# Patient Record
Sex: Female | Born: 1995 | Race: White | Hispanic: No | Marital: Single | Smoking: Never smoker
Health system: Southern US, Community
[De-identification: ages and names within clinical notes are randomized; demographics above are authoritative.]

## PROBLEM LIST (undated history)

## (undated) ENCOUNTER — Inpatient Hospital Stay: Payer: Self-pay

---

## 2014-12-31 LAB — OB RESULTS CONSOLE RUBELLA ANTIBODY, IGM: Rubella: IMMUNE

## 2014-12-31 LAB — OB RESULTS CONSOLE GC/CHLAMYDIA
CHLAMYDIA, DNA PROBE: NEGATIVE
GC PROBE AMP, GENITAL: NEGATIVE

## 2014-12-31 LAB — OB RESULTS CONSOLE HEPATITIS B SURFACE ANTIGEN: Hepatitis B Surface Ag: NEGATIVE

## 2015-03-27 ENCOUNTER — Encounter: Payer: Self-pay | Admitting: *Deleted

## 2015-03-27 ENCOUNTER — Observation Stay
Admission: EM | Admit: 2015-03-27 | Discharge: 2015-03-28 | Disposition: A | Discharge status: Home / Self Care | Payer: Medicaid Other | Attending: Obstetrics & Gynecology | Admitting: Obstetrics & Gynecology

## 2015-03-27 ENCOUNTER — Observation Stay: Payer: Medicaid Other

## 2015-03-27 DIAGNOSIS — R319 Hematuria, unspecified: Secondary | ICD-10-CM | POA: Diagnosis present

## 2015-03-27 DIAGNOSIS — R58 Hemorrhage, not elsewhere classified: Secondary | ICD-10-CM

## 2015-03-27 DIAGNOSIS — O2692 Pregnancy related conditions, unspecified, second trimester: Secondary | ICD-10-CM | POA: Diagnosis not present

## 2015-03-27 DIAGNOSIS — Z3A20 20 weeks gestation of pregnancy: Secondary | ICD-10-CM | POA: Diagnosis not present

## 2015-03-27 DIAGNOSIS — R310 Gross hematuria: Secondary | ICD-10-CM | POA: Insufficient documentation

## 2015-03-27 LAB — URINALYSIS COMPLETE WITH MICROSCOPIC (ARMC ONLY)
BILIRUBIN URINE: NEGATIVE
Bacteria, UA: NONE SEEN
Glucose, UA: NEGATIVE mg/dL
KETONES UR: NEGATIVE mg/dL
Nitrite: NEGATIVE
Protein, ur: 30 mg/dL — AB
Specific Gravity, Urine: 1.005 (ref 1.005–1.030)
pH: 8 (ref 5.0–8.0)

## 2015-03-27 NOTE — Discharge Summary (Signed)
Patty Ray is a 19 y.o. female. She is at [redacted]w[redacted]d gestation.  Indication: blood in urine  S: Resting comfortably. no CTX, no VB. Patient noted blood clots in her urine in the last day.  She has ruled out vaginal source using a q-tip in her vagina.  Every time she urinates she has visible blood and small clots. She deines dysuria, back or flank pain, feeling unwell.  O:  LMP 11/02/2014 (Approximate)    Gen: NAD, AAOx3      Abd: FNTTP      Ext: Non-tender, Nonedmeatous Back: no CVA tenderness.   FHT: present TOCO: quiet SVE: closed/long/high no blood on glove.  Results for orders placed or performed during the hospital encounter of 03/27/15 (from the past 48 hour(s))  Urinalysis complete, with microscopic [facility] only)   Collection Time: 03/27/15  5:27 PM  Result Value Ref Range   Color, Urine RED (A) YELLOW   APPearance HAZY (A) CLEAR   Glucose, UA NEGATIVE NEGATIVE mg/dL   Bilirubin Urine NEGATIVE NEGATIVE   Ketones, ur NEGATIVE NEGATIVE mg/dL   Specific Gravity, Urine 1.005 1.005 - 1.030   Hgb urine dipstick 3+ (A) NEGATIVE   pH 8.0 5.0 - 8.0   Protein, ur 30 (A) NEGATIVE mg/dL   Nitrite NEGATIVE NEGATIVE   Leukocytes, UA TRACE (A) NEGATIVE   RBC / HPF TOO NUMEROUS TO COUNT 0 - 5 RBC/hpf   WBC, UA TOO NUMEROUS TO COUNT 0 - 5 WBC/hpf   Bacteria, UA NONE SEEN NONE SEEN   Squamous Epithelial / LPF 0-5 (A) NONE SEEN   Amorphous Crystal PRESENT      US Ob Limited  03/28/2015   CLINICAL DATA:  Pelvic cramping on an off for several days.  EXAM: LIMITED OBSTETRIC ULTRASOUND  FINDINGS: Number of Fetuses: 1  Heart Rate:  155 bpm  Movement: Yes  Presentation: Cephalic  Placental Location: Anterior  Previa: No  Amniotic Fluid (Subjective):  Within normal limits.  BPD:  5.2cm 21w  5d  MATERNAL FINDINGS:  Cervix:  Appears closed.  Uterus/Adnexae:  Ovaries not visualized.  IMPRESSION: Single living intrauterine fetus estimated at 21 weeks and 5 days gestation.  Normal placenta and  normal amniotic fluid volume.  This exam is performed on an emergent basis and does not comprehensively evaluate fetal size, dating, or anatomy; follow-up complete OB US should be considered if further fetal assessment is warranted.   Electronically Signed   By: Rudie Meyer M.D.   On: 03/28/2015 09:52   US Renal  03/27/2015   CLINICAL DATA:  Bloody urine  EXAM: RENAL / URINARY TRACT ULTRASOUND COMPLETE  COMPARISON:  None.  FINDINGS: Right Kidney:  Length: 10.6 cm.  Mild hydronephrosis.  No calcified calculi.  Left Kidney:  Length: 10.6 cm.  No frank hydronephrosis.  No calcified calculi.  Bladder:  Appears normal for degree of bladder distention.  IMPRESSION: Mild right hydronephrosis. No calcified renal calculi. Unremarkable urinary bladder.   Electronically Signed   By: Natasha Mead M.D.   On: 03/27/2015 23:16      A/P:  19yo G1P0 @ 20.5 with asymptomatic gross hematuria.   Labor: not present.   No evidence of stone in bladder or kidney, mild hydronephrosis.    Report does not state - will need further investigation if placenta is having interface with bladder (precreta).  Will repeat ultrasound next week.  Does not appear to be however images unclear.  Urine culture pending  Will refer to urology as  outpatient, asymptomatic gross hematuria.  D/c home stable, precautions reviewed, follow-up as scheduled.   Ward, Elenora Fender

## 2015-03-27 NOTE — OB Triage Note (Signed)
Patient complaint of blood in urine since yesterday with cramping "of and onfor the past few days". Declines any other complaints

## 2015-04-01 ENCOUNTER — Telehealth: Payer: Self-pay | Admitting: Emergency Medicine

## 2015-04-01 NOTE — ED Notes (Signed)
Pt called for culture result.  Explained that urine culture was done but has multiple organisms and sensitivities were not done.  She has appt with cward Friday.  Patient says she is feeling fine now.  i told her that cward will decide if test should be repeated.

## 2015-04-17 LAB — URINE CULTURE

## 2015-05-19 LAB — OB RESULTS CONSOLE HGB/HCT, BLOOD
HCT: 30 %
Hemoglobin: 10.2 g/dL

## 2015-05-19 LAB — OB RESULTS CONSOLE PLATELET COUNT: PLATELETS: 187 10*3/uL

## 2015-05-19 LAB — OB RESULTS CONSOLE HIV ANTIBODY (ROUTINE TESTING): HIV: NONREACTIVE

## 2015-05-19 LAB — OB RESULTS CONSOLE RPR: RPR: NONREACTIVE

## 2015-06-19 ENCOUNTER — Observation Stay
Admission: EM | Admit: 2015-06-19 | Discharge: 2015-06-19 | Disposition: A | Discharge status: Home / Self Care | Payer: Medicaid Other | Attending: Obstetrics & Gynecology | Admitting: Obstetrics & Gynecology

## 2015-06-19 DIAGNOSIS — O479 False labor, unspecified: Secondary | ICD-10-CM | POA: Diagnosis present

## 2015-06-19 DIAGNOSIS — Z3A33 33 weeks gestation of pregnancy: Secondary | ICD-10-CM | POA: Diagnosis not present

## 2015-06-19 DIAGNOSIS — O4700 False labor before 37 completed weeks of gestation, unspecified trimester: Secondary | ICD-10-CM | POA: Diagnosis present

## 2015-06-19 LAB — URINALYSIS COMPLETE WITH MICROSCOPIC (ARMC ONLY)
Bilirubin Urine: NEGATIVE
GLUCOSE, UA: NEGATIVE mg/dL
Hgb urine dipstick: NEGATIVE
Leukocytes, UA: NEGATIVE
NITRITE: NEGATIVE
Protein, ur: NEGATIVE mg/dL
SPECIFIC GRAVITY, URINE: 1.005 (ref 1.005–1.030)
pH: 7 (ref 5.0–8.0)

## 2015-06-19 LAB — FETAL FIBRONECTIN: Fetal Fibronectin: NEGATIVE

## 2015-06-19 MED ORDER — LACTATED RINGERS IV BOLUS (SEPSIS)
1000.0000 mL | Freq: Once | INTRAVENOUS | Status: AC
  Administered 2015-06-19: 1000 mL via INTRAVENOUS

## 2015-06-19 MED ORDER — TERBUTALINE SULFATE 1 MG/ML IJ SOLN
0.2500 mg | Freq: Once | INTRAMUSCULAR | Status: AC
  Administered 2015-06-19: 0.25 mg via SUBCUTANEOUS
  Filled 2015-06-19: qty 1

## 2015-06-19 NOTE — Plan of Care (Signed)
Notified dr ward of pt contracting q 2-3 min. Order received for iv fluids. Will wait on results of FFN for further orders.

## 2015-06-19 NOTE — Discharge Summary (Signed)
Patty Ray is a 19 y.o. female. She is at 7250w5d gestation.    Indication: Presents to L&D with complaints of contractions.  Started earlier in the day and have grown progressively stronger and more frequent.  S: Resting comfortably, no VB. Active fetal movement. Concerned about preterm labor. O:  BP 120/72 mmHg  Pulse 95  Temp(Src) 98.9 F (37.2 C)  LMP 11/02/2014 (Approximate) Results for orders placed or performed during the hospital encounter of 06/19/15 (from the past 48 hour(s))  Fetal fibronectin   Collection Time: 06/19/15  3:28 AM  Result Value Ref Range   Fetal Fibronectin NEGATIVE NEGATIVE   Appearance, FETFIB CLEAR CLEAR  Urinalysis complete, with microscopic ([facility] only)   Collection Time: 06/19/15  3:29 AM  Result Value Ref Range   Color, Urine STRAW (A) YELLOW   APPearance CLEAR (A) CLEAR   Glucose, UA NEGATIVE NEGATIVE mg/dL   Bilirubin Urine NEGATIVE NEGATIVE   Ketones, ur 1+ (A) NEGATIVE mg/dL   Specific Gravity, Urine 1.005 1.005 - 1.030   Hgb urine dipstick NEGATIVE NEGATIVE   pH 7.0 5.0 - 8.0   Protein, ur NEGATIVE NEGATIVE mg/dL   Nitrite NEGATIVE NEGATIVE   Leukocytes, UA NEGATIVE NEGATIVE   RBC / HPF 0-5 0 - 5 RBC/hpf   WBC, UA 0-5 0 - 5 WBC/hpf   Bacteria, UA RARE (A) NONE SEEN   Squamous Epithelial / LPF 0-5 (A) NONE SEEN   Mucous PRESENT      Gen: NAD, AAOx3      Abd: FNTTP      Ext: Non-tender, Nonedmeatous    FHT: 145 mod + accels no decels TOCO:q2-3 min SVE: FT/70/-3   A/P: 19yo G1P0 at 32.5 with rule out preterm labor.    Labor: IV fluids, gave terbutaline for resolution of contractions; continued observation   FFN negative  Fetal Wellbeing: Reassuring Cat 1 tracing.  D/c home stable, precautions reviewed, follow-up as scheduled.   Ward, Chelsea C   ADDENDUM: The patient was monitored for about 5 hours with no cervical change.  After receiving a single dose of terbutaline and 1 liter of normal saline IV, her contractions  slowed considerably. At the time of discharge she was not feeling any contractions. She was given strong precautions to return to L&D or the clinic should her contractions return.  I recommended earlier follow up in clinic (early next week) to make sure she has had no further symptoms.  She voiced understanding and agreement with the plan.    Thomasene MohairStephen Jackson, MD 06/19/2015 11:04 AM

## 2015-06-19 NOTE — OB Triage Note (Signed)
Pt presents to l/d with c/o contractions that started at 8pm while she was at work.

## 2015-07-12 NOTE — L&D Delivery Note (Signed)
Delivery Note At 2:27 PM a viable female was delivered via Vaginal, Spontaneous Delivery (Presentation: Right Occiput Anterior).  APGAR: 7,8 ; weight 7 lb 13.6 oz (3560 g).   Placenta status: Intact, Spontaneous.  Cord: 3 vessels with the following complications: None apparent.  Cord pH: n/a  TIGHT NUCHAL X1 unable to be reduced at perineum; delivered through.  Anesthesia:  epidural Episiotomy:  no Lacerations:  1st degree perineal Suture Repair: 3.0 vicryl Est. Blood Loss (mL):  200cc  Mom to postpartum.  Baby to Couplet care / Skin to Skin.  Was called to supervise delivery due to prolonged deep decelerations with pushing.  Ultimately started pushing with every other contraction and strip remained WNL.  Baby was ROP and was rotated both manually and with maternal effort to ROA.  Excellent maternal effort.  Baby was delivered and tight nuchal cord was noted.  Attempt to reduce at perineum was unsuccessful and baby was delivered through an placed on mom's chest.  Terminal meconium noted.  After cord became pulseless >60 seconds of delayed clamping, the cord was doubly clamped and cut by FOB, then attended to under the warmer by pediatric team.  Cord blood was colleted and placenta delivered spontaneously.  Shallow 1st degree repaired with 3-0 vicryl in standard fashion.  Baby was placed back on mom's chest for skin to skin and we sang happy birthday to Scientist, physiological.  Ward, Chelsea C 08/05/2015, 2:43 PM

## 2015-07-13 ENCOUNTER — Observation Stay
Admission: EM | Admit: 2015-07-13 | Discharge: 2015-07-13 | Disposition: A | Discharge status: Home / Self Care | Payer: Medicaid Other | Attending: Obstetrics and Gynecology | Admitting: Obstetrics and Gynecology

## 2015-07-13 DIAGNOSIS — O4700 False labor before 37 completed weeks of gestation, unspecified trimester: Secondary | ICD-10-CM | POA: Diagnosis present

## 2015-07-13 DIAGNOSIS — Z3A36 36 weeks gestation of pregnancy: Secondary | ICD-10-CM | POA: Insufficient documentation

## 2015-07-13 LAB — URINALYSIS COMPLETE WITH MICROSCOPIC (ARMC ONLY)
Bilirubin Urine: NEGATIVE
Glucose, UA: NEGATIVE mg/dL
HGB URINE DIPSTICK: NEGATIVE
LEUKOCYTES UA: NEGATIVE
NITRITE: NEGATIVE
PH: 7 (ref 5.0–8.0)
Protein, ur: NEGATIVE mg/dL
SPECIFIC GRAVITY, URINE: 1.009 (ref 1.005–1.030)

## 2015-07-13 MED ORDER — ACETAMINOPHEN 325 MG PO TABS: 650.0000 mg | ORAL_TABLET | ORAL | Status: DC | PRN

## 2015-07-13 NOTE — Discharge Summary (Signed)
Obstetric History and Physical  Patty Ray is a 20 y.o. G1P0 with Estimated Date of Delivery: 08/09/15 per 5 wk US who presents at 5816w1d  presenting for c/o regular contractions. Patient states she has been having  no vaginal bleeding, intact membranes, with active fetal movement.    Prenatal Course Source of Care: [facility]  with transfer of care at 18 weeks Pregnancy complications or risks: Patient Active Problem List   Diagnosis Date Noted  . Threatened preterm labor 07/13/2015  . Preterm contractions 06/19/2015  . Labor and delivery indication for care or intervention 06/19/2015     Prenatal Transfer Tool   No past medical history.  No past surgical history.  OB History  Gravida Para Term Preterm AB SAB TAB Ectopic Multiple Living  1             # Outcome Date GA Lbr Len/2nd Weight Sex Delivery Anes PTL Lv  1 Current               Social History   Social History  . Marital Status: Single    Spouse Name: N/A  . Number of Children: N/A  . Years of Education: N/A   Social History Main Topics  . Smoking status: Never Smoker   . Smokeless tobacco: Never Used  . Alcohol Use: No  . Drug Use: No  . Sexual Activity: Yes   Other Topics Concern  . None   Social History Narrative    No family history on file.  No prescriptions prior to admission    No Known Allergies  Review of Systems: Negative except for what is mentioned in HPI.  Physical Exam: BP 115/63 mmHg  Pulse 117  Temp(Src) 98.1 F (36.7 C) (Oral)  Resp 16  Ht 5' (1.524 m)  Wt 132 lb (59.875 kg)  BMI 25.78 kg/m2  LMP 11/02/2014 (Approximate) GENERAL: Well-developed, well-nourished female in no acute distress.  Cervical Exam: 1/thick per RN, unchanged after 1 hour FHT: Category: 1 Baseline rate 135 bpm   Variability moderate  Accelerations present   Decelerations none Contractions: Every 1-5 mins, mild   Pertinent Labs/Studies:   UA: Negative except 1+ Ketones, mucous present  Assessment  : IUP at 7416w1d with preterm contractions without evidence of labor  Plan: Discharge Home  Preterm labor precautions - f/u at office as scheduled this week

## 2015-07-13 NOTE — OB Triage Note (Addendum)
Mrs. Sharon SellerLloyd is a 20 yo G1P0 at 36.1 gestation presenting to the unit for contractions every 2 minutes since 4pm.  The contractions decrease when she is walking and are worse when she's sitting or lying down. She reports +FM, -LOF, -HA, -Visual Disturbances, -N/V, -Swelling. She has some mucousy discharge x 1 week.  Patient states that she has been drinking a lot of water and is well hydrated.   Lesia Hausenina M Curby Carswell

## 2015-07-13 NOTE — Progress Notes (Signed)
Patient discharged to home in stable condition.  Labor precautions reviewed.  Patient advised to continue with regularly scheduled prenatal care and to continue with po hydration. Lesia Hausenina M Kory Rains

## 2015-08-05 ENCOUNTER — Inpatient Hospital Stay: Payer: Medicaid Other | Admitting: Registered Nurse

## 2015-08-05 ENCOUNTER — Inpatient Hospital Stay
Admission: EM | Admit: 2015-08-05 | Discharge: 2015-08-07 | DRG: 775 | Disposition: A | Discharge status: Home / Self Care | Payer: Medicaid Other | Attending: Obstetrics & Gynecology | Admitting: Obstetrics & Gynecology

## 2015-08-05 DIAGNOSIS — O4292 Full-term premature rupture of membranes, unspecified as to length of time between rupture and onset of labor: Principal | ICD-10-CM | POA: Diagnosis present

## 2015-08-05 DIAGNOSIS — O4290 Premature rupture of membranes, unspecified as to length of time between rupture and onset of labor, unspecified weeks of gestation: Secondary | ICD-10-CM | POA: Diagnosis present

## 2015-08-05 DIAGNOSIS — Z3A39 39 weeks gestation of pregnancy: Secondary | ICD-10-CM

## 2015-08-05 LAB — CBC
HCT: 30.3 % — ABNORMAL LOW (ref 35.0–47.0)
Hemoglobin: 10.3 g/dL — ABNORMAL LOW (ref 12.0–16.0)
MCH: 30.4 pg (ref 26.0–34.0)
MCHC: 33.9 g/dL (ref 32.0–36.0)
MCV: 89.9 fL (ref 80.0–100.0)
PLATELETS: 212 10*3/uL (ref 150–440)
RBC: 3.37 MIL/uL — ABNORMAL LOW (ref 3.80–5.20)
RDW: 12.7 % (ref 11.5–14.5)
WBC: 8.8 10*3/uL (ref 3.6–11.0)

## 2015-08-05 MED ORDER — OXYTOCIN 10 UNIT/ML IJ SOLN
INTRAMUSCULAR | Status: AC
  Filled 2015-08-05: qty 2

## 2015-08-05 MED ORDER — AMMONIA AROMATIC IN INHA
RESPIRATORY_TRACT | Status: AC
  Filled 2015-08-05: qty 10

## 2015-08-05 MED ORDER — LIDOCAINE-EPINEPHRINE (PF) 1.5 %-1:200000 IJ SOLN
INTRAMUSCULAR | Status: DC | PRN
  Administered 2015-08-05: 3 mL via EPIDURAL

## 2015-08-05 MED ORDER — IBUPROFEN 600 MG PO TABS
600.0000 mg | ORAL_TABLET | Freq: Four times a day (QID) | ORAL | Status: DC
  Administered 2015-08-05 – 2015-08-07 (×8): 600 mg via ORAL
  Filled 2015-08-05 (×8): qty 1

## 2015-08-05 MED ORDER — CITRIC ACID-SODIUM CITRATE 334-500 MG/5ML PO SOLN
30.0000 mL | ORAL | Status: DC | PRN
  Administered 2015-08-05: 30 mL via ORAL
  Filled 2015-08-05: qty 30

## 2015-08-05 MED ORDER — DIBUCAINE 1 % RE OINT: 1.0000 "application " | TOPICAL_OINTMENT | RECTAL | Status: DC | PRN

## 2015-08-05 MED ORDER — BENZOCAINE-MENTHOL 20-0.5 % EX AERO
1.0000 "application " | INHALATION_SPRAY | CUTANEOUS | Status: DC | PRN
  Filled 2015-08-05: qty 56

## 2015-08-05 MED ORDER — DOCUSATE SODIUM 100 MG PO CAPS
100.0000 mg | ORAL_CAPSULE | Freq: Two times a day (BID) | ORAL | Status: DC
  Administered 2015-08-06 – 2015-08-07 (×4): 100 mg via ORAL
  Filled 2015-08-05 (×4): qty 1

## 2015-08-05 MED ORDER — LIDOCAINE HCL (PF) 1 % IJ SOLN
30.0000 mL | INTRAMUSCULAR | Status: DC | PRN
  Filled 2015-08-05: qty 30

## 2015-08-05 MED ORDER — BUPIVACAINE HCL (PF) 0.25 % IJ SOLN
INTRAMUSCULAR | Status: DC | PRN
  Administered 2015-08-05 (×2): 4 mL via EPIDURAL

## 2015-08-05 MED ORDER — LIDOCAINE HCL (PF) 1 % IJ SOLN
INTRAMUSCULAR | Status: AC
  Filled 2015-08-05: qty 30

## 2015-08-05 MED ORDER — FENTANYL 2.5 MCG/ML W/ROPIVACAINE 0.2% IN NS 100 ML EPIDURAL INFUSION (ARMC-ANES)
EPIDURAL | Status: AC
  Administered 2015-08-05: 10 mL/h via EPIDURAL
  Filled 2015-08-05: qty 100

## 2015-08-05 MED ORDER — WITCH HAZEL-GLYCERIN EX PADS: 1.0000 "application " | MEDICATED_PAD | CUTANEOUS | Status: DC | PRN

## 2015-08-05 MED ORDER — LANOLIN HYDROUS EX OINT: TOPICAL_OINTMENT | CUTANEOUS | Status: DC | PRN

## 2015-08-05 MED ORDER — HYDROCODONE-ACETAMINOPHEN 5-325 MG PO TABS: 1.0000 | ORAL_TABLET | Freq: Four times a day (QID) | ORAL | Status: DC | PRN

## 2015-08-05 MED ORDER — PRENATAL MULTIVITAMIN CH
1.0000 | ORAL_TABLET | Freq: Every day | ORAL | Status: DC
  Administered 2015-08-06 – 2015-08-07 (×2): 1 via ORAL
  Filled 2015-08-05 (×2): qty 1

## 2015-08-05 MED ORDER — SIMETHICONE 80 MG PO CHEW: 80.0000 mg | CHEWABLE_TABLET | ORAL | Status: DC | PRN

## 2015-08-05 MED ORDER — LACTATED RINGERS IV SOLN
INTRAVENOUS | Status: DC
  Administered 2015-08-05 (×3): via INTRAVENOUS

## 2015-08-05 MED ORDER — ACETAMINOPHEN 325 MG PO TABS: 650.0000 mg | ORAL_TABLET | ORAL | Status: DC | PRN

## 2015-08-05 MED ORDER — ONDANSETRON HCL 4 MG/2ML IJ SOLN: 4.0000 mg | INTRAMUSCULAR | Status: DC | PRN

## 2015-08-05 MED ORDER — OXYTOCIN BOLUS FROM INFUSION
500.0000 mL | INTRAVENOUS | Status: DC
  Administered 2015-08-05: 500 mL via INTRAVENOUS

## 2015-08-05 MED ORDER — OXYTOCIN 40 UNITS IN LACTATED RINGERS INFUSION - SIMPLE MED
2.5000 [IU]/h | INTRAVENOUS | Status: DC
  Administered 2015-08-05: 2.5 [IU]/h via INTRAVENOUS
  Filled 2015-08-05: qty 1000

## 2015-08-05 MED ORDER — FERROUS SULFATE 325 (65 FE) MG PO TABS
325.0000 mg | ORAL_TABLET | Freq: Every day | ORAL | Status: DC
  Administered 2015-08-06 – 2015-08-07 (×2): 325 mg via ORAL
  Filled 2015-08-05 (×2): qty 1

## 2015-08-05 MED ORDER — ONDANSETRON HCL 4 MG PO TABS: 4.0000 mg | ORAL_TABLET | ORAL | Status: DC | PRN

## 2015-08-05 MED ORDER — MISOPROSTOL 200 MCG PO TABS
ORAL_TABLET | ORAL | Status: AC
  Filled 2015-08-05: qty 4

## 2015-08-05 MED ORDER — LACTATED RINGERS IV SOLN
500.0000 mL | INTRAVENOUS | Status: DC | PRN
  Administered 2015-08-05 (×2): 500 mL via INTRAVENOUS

## 2015-08-05 MED ORDER — ONDANSETRON HCL 4 MG/2ML IJ SOLN: 4.0000 mg | Freq: Four times a day (QID) | INTRAMUSCULAR | Status: DC | PRN

## 2015-08-05 MED ORDER — BUTORPHANOL TARTRATE 1 MG/ML IJ SOLN: 1.0000 mg | INTRAMUSCULAR | Status: DC | PRN

## 2015-08-05 NOTE — Progress Notes (Signed)
L&D Progress Note  S: Comfortable with epidural. Feeling some rectal pressure.  O: BP 120/70 mmHg  Pulse 70  Temp(Src) 98.2 F (36.8 C) (Oral)  Resp 16  Ht 5' (1.524 m)  Wt 61.236 kg (135 lb)  BMI 26.37 kg/m2  SpO2 100%  LMP 11/02/2014 (Approximate)   FHR: 135-140 with accelerations to 150s to 160s, moderate variability. Cat 2 tracing resolved with position change and increasing amnioinfusion rate to 140ml/hr and applying O2.  Toco: q1-4 min apart with some couplets/triplets with occasional mild variable Cervix: 9/90%/+1  Have discussed the FHR pattern with both Dr Vergie Living and Dr Elesa Massed (Dr Ward called back to OR).  A: Progressing and now with Cat 1 tracing  P: Continue to monitor fetal well being and cervical progress  Patty Ray, CNM

## 2015-08-05 NOTE — H&P (Signed)
Obstetric H&P   Chief Complaint: "I think my water broke"  Prenatal Care Provider: [facility]  History of Present Illness: 20 y.o. G1P0 [redacted]w[redacted]d by 08/09/2015, presenting to L&D with clear gush of fluid shortly before midnight.  Irregular contraction over the past week which have since intensified.  Last clinic check was 1cm.  +FM, no VB.  PNC at [facility] unremarkable, transfer of care from [facility]  A pos / RI / HBsAg neg / HIV neg / RPR NR /  Varicella unknown / CF neg / GBS negative  Received TDAP 06/02/15  Review of Systems: 10 point review of systems negative unless otherwise noted in HPI  Past Medical History: History reviewed. No pertinent past medical history.  Past Surgical History: History reviewed. No pertinent past surgical history.   Family History: History reviewed. No pertinent family history.  Social History: Social History   Social History  . Marital Status: Single    Spouse Name: N/A  . Number of Children: N/A  . Years of Education: N/A   Occupational History  . Not on file.   Social History Main Topics  . Smoking status: Never Smoker   . Smokeless tobacco: Never Used  . Alcohol Use: No  . Drug Use: No  . Sexual Activity: Yes   Other Topics Concern  . Not on file   Social History Narrative    Medications: Prior to Admission medications   Medication Sig Start Date End Date Taking? Authorizing Provider  acetaminophen (TYLENOL) 325 MG tablet Take 650 mg by mouth every 6 (six) hours as needed.   Yes Historical Provider, MD  ranitidine (ZANTAC) 150 MG capsule Take 150 mg by mouth 2 (two) times daily.   Yes Historical Provider, MD    Allergies: No Known Allergies  Physical Exam: Vitals: Blood pressure 129/77, pulse 83, temperature 98.3 F (36.8 C), temperature source Oral, resp. rate 16, height 5' (1.524 m), weight 61.236 kg (135 lb), last menstrual period 11/02/2014.  FHT: 140, moderate, positive accels, no decels Toco: q2-73min  General: NAD HEENT:  normocephalic anicter Pulmonary: no increased work of breathing Cardiovascular: RRR Abdomen: Gravid, non-tender Leopolds: vtx Genitourinary: 2.5 / 50 / -2 Extremities: no edema  Labs: Results for orders placed or performed during the hospital encounter of 08/05/15 (from the past 24 hour(s))  CBC     Status: Abnormal   Collection Time: 08/05/15  2:52 AM  Result Value Ref Range   WBC 8.8 3.6 - 11.0 K/uL   RBC 3.37 (L) 3.80 - 5.20 MIL/uL   Hemoglobin 10.3 (L) 12.0 - 16.0 g/dL   HCT 16.1 (L) 09.6 - 04.5 %   MCV 89.9 80.0 - 100.0 fL   MCH 30.4 26.0 - 34.0 pg   MCHC 33.9 32.0 - 36.0 g/dL   RDW 40.9 81.1 - 91.4 %   Platelets 212 150 - 440 K/uL    Assessment: 19 y.o. G1P0 [redacted]w[redacted]d by 08/09/2015, SROM, term labor  Plan: 1) Labor - expectant management  2) Fetus - cat I tracing  3) A pos / RI / HBsAg neg / HIV neg / RPR NR /  Varicella unknown / CF neg / GBS negative  4) TDAP - up to date  5) Disposition - pending delivery

## 2015-08-05 NOTE — Discharge Summary (Addendum)
Physician Obstetric Discharge Summary  Patient ID: Patty Ray MRN: [id number] DOB/AGE: [birth year] 20 y.o.   Date of Admission: 08/05/2015  Date of Discharge: 08/07/15  Admitting Diagnosis: Premature rupture of membrane at [redacted]w[redacted]d  Secondary Diagnosis: N/A  Mode of Delivery:Spontaneous vaginal delivery on 08/05/2015      Discharge Diagnosis: Term intrauterine pregnancy delivered, Fetal heart rate decelerations, Tight nuchal cord x1   Intrapartum Procedures: epidural, placement of intrauterine catheter and repair of first degree perineal laceration, amnioinfusion   Post partum procedures: none  Complications: none  Brief Hospital Course  Patty Ray is a G1P1001 who had a SVD on 08/05/2015;  for further details of this delivery, please refer to the delivery note.  Patient had an uncomplicated postpartum course.  By time of discharge on PPD#2, her pain was controlled on oral pain medications; she had appropriate lochia and was ambulating, voiding without difficulty and tolerating regular diet.  She was deemed stable for discharge to home and had no s/s of anemia   Recent Labs Lab 08/05/15 0252 08/06/15 0629  WBC 8.8 13.8*  HGB 10.3* 8.8*  HCT 30.3* 26.6*  PLT 212 147*    A POS/ Rubella Immune/ Varicella (pending)/ GBS negative Physical exam:  Blood pressure 112/64, pulse 71, temperature 98.6 F (37 C), temperature source Oral, resp. rate 16, height 5' (1.524 m), weight 61.236 kg (135 lb), last menstrual period 11/02/2014, SpO2 100 %, General: alert and no distress Lochia: appropriate Abdomen: soft, NT Uterine Fundus: firm Extremities: No evidence of DVT seen on physical exam. No lower extremity edema.  Discharge Instructions: Per After Visit Summary. Activity: Advance as tolerated. Pelvic rest for 4 weeks.  Also refer to Discharge Instructions Diet: Regular Medications:   Medication List    STOP taking these medications        acetaminophen 325 MG tablet   Commonly known as:  TYLENOL     ranitidine 150 MG capsule  Commonly known as:  ZANTAC      TAKE these medications        ferrous sulfate 325 (65 FE) MG tablet  Take 1 tablet (325 mg total) by mouth daily.        Outpatient follow up:  Follow-up Information    Follow up with Ward, Elenora Fender, MD. Schedule an appointment as soon as possible for a visit in 4 weeks.   Specialty:  Obstetrics and Gynecology   Why:  postpartum check   Contact information:   [address] [place] [address] [phone]      Postpartum contraception: IUD, no bridge. Liletta d/w pt  Discharged Condition: good  Discharged to: home   Newborn Data: Disposition:home with mother  Apgars: APGAR (1 MIN): 7   APGAR (5 MINS): 8   APGAR (10 MINS):    Baby Feeding: breast/ Patty Ray/ 7#13.6 oz  Cornelia Copa MD [facility]  Pager: [phone]

## 2015-08-05 NOTE — Anesthesia Preprocedure Evaluation (Signed)
Anesthesia Evaluation  Patient identified by MRN, date of birth, ID band Patient awake    Reviewed: Allergy & Precautions, H&P , NPO status , Patient's Chart, lab work & pertinent test results  History of Anesthesia Complications Negative for: history of anesthetic complications  Airway Mallampati: II  TM Distance: >3 FB Neck ROM: full    Dental no notable dental hx.    Pulmonary neg pulmonary ROS,    Pulmonary exam normal       Cardiovascular negative cardio ROS Normal cardiovascular exam    Neuro/Psych negative neurological ROS  negative psych ROS   GI/Hepatic negative GI ROS, Neg liver ROS,   Endo/Other  negative endocrine ROS  Renal/GU negative Renal ROS  negative genitourinary   Musculoskeletal   Abdominal   Peds  Hematology negative hematology ROS (+)   Anesthesia Other Findings   Reproductive/Obstetrics (+) Pregnancy                             Anesthesia Physical Anesthesia Plan  ASA: II  Anesthesia Plan: Epidural   Post-op Pain Management:    Induction:   Airway Management Planned:   Additional Equipment:   Intra-op Plan:   Post-operative Plan:   Informed Consent: I have reviewed the patients History and Physical, chart, labs and discussed the procedure including the risks, benefits and alternatives for the proposed anesthesia with the patient or authorized representative who has indicated his/her understanding and acceptance.     Plan Discussed with: Anesthesiologist  Anesthesia Plan Comments:         Anesthesia Quick Evaluation  

## 2015-08-05 NOTE — Progress Notes (Signed)
RN to the bedside to assist pt. With ambulation to BR for post delivery void.

## 2015-08-05 NOTE — Anesthesia Procedure Notes (Signed)
Epidural Patient location during procedure: OB Start time: 08/05/2015 7:23 AM End time: 08/05/2015 7:26 AM  Staffing Resident/CRNA: Stormy Fabian Performed by: resident/CRNA   Preanesthetic Checklist Completed: patient identified, site marked, surgical consent, pre-op evaluation, timeout performed, IV checked, risks and benefits discussed and monitors and equipment checked  Epidural Patient position: sitting Prep: Betadine Patient monitoring: heart rate, continuous pulse ox and blood pressure Approach: midline Location: L4-L5 Injection technique: LOR saline  Needle:  Needle type: Tuohy  Needle gauge: 18 G Needle length: 9 cm and 9 Needle insertion depth: 5 cm Catheter type: closed end flexible Catheter size: 20 Guage Catheter at skin depth: 10 cm Test dose: negative and 1.5% lidocaine with Epi 1:200 K  Assessment Events: blood not aspirated, injection not painful, no injection resistance, negative IV test and no paresthesia  Additional Notes   Patient tolerated the insertion well without complications.Reason for block:procedure for pain

## 2015-08-05 NOTE — Progress Notes (Signed)
L&D Note  08/05/2015 - 8:33 AM  20 y.o. G1P0 [redacted]w[redacted]d by a 5wk5d ultrasound.   Patient Active Problem List   Diagnosis Date Noted  . PROM (premature rupture of membranes) 08/05/2015  . Threatened preterm labor 07/13/2015  . Preterm contractions 06/19/2015  . Labor and delivery indication for care or intervention 06/19/2015  . Blood in urine 03/27/2015    Ms. Reeves Dam was admitted for PROM around midnight last night. Progressed on her own from 2.5 to 5 cm at which time she received an epidural. Has been having FHR decelerations after the epidural   Subjective:  Comfortable  Objective:   Filed Vitals:   08/05/15 0737 08/05/15 0742 08/05/15 0750 08/05/15 0802  BP: 130/76 128/67 116/84 104/56  Pulse: 114 102 92 72  Temp:      TempSrc:      Resp:      Height:      Weight:      SpO2:        Current Vital Signs 24h Vital Sign Ranges  T 97.7 F (36.5 C) Temp  Avg: 98 F (36.7 C)  Min: 97.7 F (36.5 C)  Max: 98.3 F (36.8 C)  BP (!) 104/56 mmHg BP  Min: 104/56  Max: 130/76  HR 72 Pulse  Avg: 93.1  Min: 72  Max: 114  RR 16 Resp  Avg: 16  Min: 16  Max: 16  SaO2 100 % Not Delivered SpO2  Avg: 100 %  Min: 100 %  Max: 100 %       24 Hour I/O Current Shift I/O  Time Ins Outs       FHR: baseline 145 with two prolonged decelerations to 90s x 4 minutes. IV fluid bolus was in progress as the blood pressure was a little lower after epidural. IUPC inserted and amnioinfusion started because there appeared to be late decelerations to 120s to 130s. Position changes and Bladder emptied. Variability moderate. Toco: Irregular contractions with runs of 2-3 ctxs SVE: 5to 5.5cm/ 70%/ -1 to 0  Labs:   Recent Labs Lab 08/05/15 0252  WBC 8.8  HGB 10.3*  HCT 30.3*  PLT 212   No results for input(s): NA, K, CL, CO2, BUN, CREATININE, CALCIUM, PROT, BILITOT, ALKPHOS, ALT, AST, GLUCOSE in the last 168 hours.  Invalid input(s): LABALBU  Medications Current Facility-Administered  Medications  Medication Dose Route Frequency Provider Last Rate Last Dose  . acetaminophen (TYLENOL) tablet 650 mg  650 mg Oral Q4H PRN Vena Austria, MD      . ammonia inhalant           . butorphanol (STADOL) injection 1-2 mg  1-2 mg Intravenous Q3H PRN Vena Austria, MD      . citric acid-sodium citrate (ORACIT) solution 30 mL  30 mL Oral Q2H PRN Vena Austria, MD   30 mL at 08/05/15 0536  . lactated ringers infusion 500-1,000 mL  500-1,000 mL Intravenous PRN Vena Austria, MD 1,000 mL/hr at 08/05/15 0748 500 mL at 08/05/15 0748  . lactated ringers infusion   Intravenous Continuous Vena Austria, MD 125 mL/hr at 08/05/15 [phone]    . lidocaine (PF) (XYLOCAINE) 1 % injection 30 mL  30 mL Subcutaneous PRN Vena Austria, MD      . lidocaine (PF) (XYLOCAINE) 1 % injection           . misoprostol (CYTOTEC) 200 MCG tablet           . ondansetron (ZOFRAN) injection 4 mg  4 mg Intravenous Q6H PRN Vena Austria, MD      . oxytocin (PITOCIN) 10 UNIT/ML injection           . oxytocin (PITOCIN) IV BOLUS FROM BAG  500 mL Intravenous Continuous Vena Austria, MD      . oxytocin (PITOCIN) IV infusion 40 units in LR 1000 mL - Premix  2.5 Units/hr Intravenous Continuous Vena Austria, MD       Facility-Administered Medications Ordered in Other Encounters  Medication Dose Route Frequency Provider Last Rate Last Dose  . bupivacaine (PF) (MARCAINE) 0.25 % injection    Anesthesia Intra-op Stormy Fabian, CRNA   4 mL at 08/05/15 0734  . lidocaine-EPINEPHrine 1.5 %-1:200000 injection    Anesthesia Intra-op Stormy Fabian, CRNA   3 mL at 08/05/15 0726    Assessment & Plan:   IUP at 39.3 weeks with SROM in early labor with good relief of pain with epidural  Currently not in an adequate pattern post epidural  Cat 2 tracing - late decelerations are resolved with amnioinfusion and position changes and IV bolus-having  some early/ variable decelerations at this time-continue to monitor  closely GBS negative  Farrel Conners, CNM   Cornelia Copa MD [facility] Pager [phone]

## 2015-08-06 LAB — CHLAMYDIA/NGC RT PCR (ARMC ONLY)
Chlamydia Tr: NOT DETECTED
N GONORRHOEAE: NOT DETECTED

## 2015-08-06 LAB — CBC
HEMATOCRIT: 26.6 % — AB (ref 35.0–47.0)
HEMOGLOBIN: 8.8 g/dL — AB (ref 12.0–16.0)
MCH: 29.9 pg (ref 26.0–34.0)
MCHC: 33.1 g/dL (ref 32.0–36.0)
MCV: 90.4 fL (ref 80.0–100.0)
Platelets: 147 10*3/uL — ABNORMAL LOW (ref 150–440)
RBC: 2.94 MIL/uL — ABNORMAL LOW (ref 3.80–5.20)
RDW: 13 % (ref 11.5–14.5)
WBC: 13.8 10*3/uL — AB (ref 3.6–11.0)

## 2015-08-06 LAB — RPR: RPR: NONREACTIVE

## 2015-08-06 MED ORDER — FERROUS SULFATE 325 (65 FE) MG PO TABS: 325.0000 mg | ORAL_TABLET | Freq: Every day | ORAL | Status: AC

## 2015-08-06 NOTE — Anesthesia Postprocedure Evaluation (Signed)
Anesthesia Post Note  Patient: Patty Ray  Procedure(s) Performed: * No procedures listed *  Patient location during evaluation: Mother Baby Anesthesia Type: Epidural Level of consciousness: awake and alert and oriented Pain management: satisfactory to patient Vital Signs Assessment: post-procedure vital signs reviewed and stable Respiratory status: respiratory function stable Cardiovascular status: stable Postop Assessment: no headache and no backache Anesthetic complications: no    Last Vitals:  Filed Vitals:   08/06/15 0753 08/06/15 0815  BP: 116/72 107/57  Pulse: 64 74  Temp: 37.1 C 37.1 C  Resp: 20 18    Last Pain:  Filed Vitals:   08/06/15 0815  PainSc: Asleep                 Clydene Pugh

## 2015-08-06 NOTE — Anesthesia Post-op Follow-up Note (Signed)
  Anesthesia Pain Follow-up Note  Patient: Patty Ray  Day #: 1  Date of Follow-up: 08/06/2015 Time: 9:07 AM  Last Vitals:  Filed Vitals:   08/06/15 0753 08/06/15 0815  BP: 116/72 107/57  Pulse: 64 74  Temp: 37.1 C 37.1 C  Resp: 20 18    Level of Consciousness: alert  Pain: mild   Side Effects:None  Catheter Site Exam: site not evaluated  Plan: D/C from anesthesia care  Clydene Pugh

## 2015-08-07 LAB — VARICELLA ZOSTER ANTIBODY, IGG: Varicella IgG: <135 index — ABNORMAL LOW (ref >165)

## 2015-08-07 MED ORDER — INFLUENZA VAC SPLIT QUAD 0.5 ML IM SUSY: 0.5000 mL | PREFILLED_SYRINGE | INTRAMUSCULAR | Status: DC

## 2015-08-07 NOTE — Progress Notes (Signed)
Daily Post Partum Note  Patty Ray is a 20 y.o. G1P1001 PPD#1 s/p  SVD/1st (repaired)  @ [redacted]w[redacted]d.  Pregnancy c/b nothing  24hr/overnight events:  none  Subjective:  Meeting all PP goals  Objective:    Current Vital Signs 24h Vital Sign Ranges  T  (asleep) Temp  Avg: 98.4 F (36.9 C)  Min: 97.8 F (36.6 C)  Max: 98.7 F (37.1 C)  BP 110/65 mmHg BP  Min: 107/57  Max: 116/72  HR 61 Pulse  Avg: 68.6  Min: 61  Max: 78  RR 18 Resp  Avg: 18.8  Min: 18  Max: 20  SaO2 99 % Not Delivered SpO2  Avg: 99 %  Min: 99 %  Max: 99 %       24 Hour I/O Current Shift I/O  Time Ins Outs 01/26 0701 - 01/27 0700 In: 240 [P.O.:240] Out: -       General: NAD Abdomen: FF below the umbilicus, nttp Perineum: deferred Skin:  Warm and dry.  Cardiovascular: no c/c/e Respiratory:  Normal respiratory effort  Medications Current Facility-Administered Medications  Medication Dose Route Frequency Provider Last Rate Last Dose  . benzocaine-Menthol (DERMOPLAST) 20-0.5 % topical spray 1 application  1 application Topical PRN Farrel Conners, CNM      . witch hazel-glycerin (TUCKS) pad 1 application  1 application Topical PRN Farrel Conners, CNM       And  . dibucaine (NUPERCAINAL) 1 % rectal ointment 1 application  1 application Rectal PRN Farrel Conners, CNM      . docusate sodium (COLACE) capsule 100 mg  100 mg Oral BID Farrel Conners, CNM   100 mg at 08/07/15 0112  . ferrous sulfate tablet 325 mg  325 mg Oral Q breakfast Farrel Conners, CNM   325 mg at 08/06/15 1610  . HYDROcodone-acetaminophen (NORCO/VICODIN) 5-325 MG per tablet 1-2 tablet  1-2 tablet Oral Q6H PRN Farrel Conners, CNM      . ibuprofen (ADVIL,MOTRIN) tablet 600 mg  600 mg Oral 4 times per day Farrel Conners, CNM   600 mg at 08/07/15 0615  . lanolin ointment   Topical PRN Farrel Conners, CNM      . ondansetron (ZOFRAN) tablet 4 mg  4 mg Oral Q4H PRN Farrel Conners, CNM      . prenatal multivitamin tablet 1  tablet  1 tablet Oral Q1200 Farrel Conners, CNM   1 tablet at 08/06/15 1233  . simethicone (MYLICON) chewable tablet 80 mg  80 mg Oral PRN Farrel Conners, CNM        Labs:   Recent Labs Lab 08/05/15 0252 08/06/15 0629  WBC 8.8 13.8*  HGB 10.3* 8.8*  HCT 30.3* 26.6*  PLT 212 147*    Assessment & Plan:  Pt doing well *Postpartum/postop: routine care *Dispo: later today  A pos / Rubella Immune / Varicella unknown/  RPR negative / HIV negative / HepBsAg negative / Tdap UTD: UTD/Flu shot: ordered/ Breast  / Contraception: IUD no bridge / Follow up: Johnsie Kindred. MD [facility] Pager [phone]

## 2015-08-07 NOTE — Discharge Instructions (Signed)
Discharge instructions:  ° °Call office if you have any of the following: headache, visual changes, fever >100 F, chills, breast concerns, excessive vaginal bleeding, incision drainage or problems, leg pain or redness, depression or any other concerns.  ° °Activity: Do not lift > 10 lbs for 6 weeks.  °No intercourse or tampons for 6 weeks.  °No driving for 1-2 weeks.  ° ° °Vaginal Delivery, Care After °Refer to this sheet in the next few weeks. These discharge instructions provide you with information on caring for yourself after delivery. Your caregiver may also give you specific instructions. Your treatment has been planned according to the most current medical practices available, but problems sometimes occur. Call your caregiver if you have any problems or questions after you go home. °HOME CARE INSTRUCTIONS °1. Take over-the-counter or prescription medicines only as directed by your caregiver or pharmacist. °2. Do not drink alcohol, especially if you are breastfeeding or taking medicine to relieve pain. °3. Do not smoke tobacco. °4. Continue to use good perineal care. Good perineal care includes: °1. Wiping your perineum from back to front °2. Keeping your perineum clean. °3. You can do sitz baths twice a day, to help keep this area clean °5. Do not use tampons, douche or have sex until your caregiver says it is okay. °6. Shower only and avoid sitting in submerged water, aside from sitz baths °7. Wear a well-fitting bra that provides breast support. °8. Eat healthy foods. °9. Drink enough fluids to keep your urine clear or pale yellow. °10. Eat high-fiber foods such as whole grain cereals and breads, brown rice, beans, and fresh fruits and vegetables every day. These foods may help prevent or relieve constipation. °11. Avoid constipation with high fiber foods or medications, such as miralax or metamucil °12. Follow your caregiver's recommendations regarding resumption of activities such as climbing stairs,  driving, lifting, exercising, or traveling. °13. Talk to your caregiver about resuming sexual activities. Resumption of sexual activities is dependent upon your risk of infection, your rate of healing, and your comfort and desire to resume sexual activity. °14. Try to have someone help you with your household activities and your newborn for at least a few days after you leave the hospital. °15. Rest as much as possible. Try to rest or take a nap when your newborn is sleeping. °16. Increase your activities gradually. °17. Keep all of your scheduled postpartum appointments. It is very important to keep your scheduled follow-up appointments. At these appointments, your caregiver will be checking to make sure that you are healing physically and emotionally. °SEEK MEDICAL CARE IF:  °· You are passing large clots from your vagina. Save any clots to show your caregiver. °· You have a foul smelling discharge from your vagina. °· You have trouble urinating. °· You are urinating frequently. °· You have pain when you urinate. °· You have a change in your bowel movements. °· You have increasing redness, pain, or swelling near your vaginal incision (episiotomy) or vaginal tear. °· You have pus draining from your episiotomy or vaginal tear. °· Your episiotomy or vaginal tear is separating. °· You have painful, hard, or reddened breasts. °· You have a severe headache. °· You have blurred vision or see spots. °· You feel sad or depressed. °· You have thoughts of hurting yourself or your newborn. °· You have questions about your care, the care of your newborn, or medicines. °· You are dizzy or light-headed. °· You have a rash. °· You have   have nausea or vomiting.  You were breastfeeding and have not had a menstrual period within 12 weeks after you stopped breastfeeding.  You are not breastfeeding and have not had a menstrual period by the 12th week after delivery.  You have a fever. SEEK IMMEDIATE MEDICAL CARE IF:   You have  persistent pain.  You have chest pain.  You have shortness of breath.  You faint.  You have leg pain.  You have stomach pain.  Your vaginal bleeding saturates two or more sanitary pads in 1 hour. MAKE SURE YOU:   Understand these instructions.  Will watch your condition.  Will get help right away if you are not doing well or get worse. Document Released: 06/24/2000 Document Revised: 11/11/2013 Document Reviewed: 02/22/2012 [facility] Patient Information 2015 [place], [place]. This information is not intended to replace advice given to you by your health care provider. Make sure you discuss any questions you have with your health care provider.  Sitz Bath A sitz bath is a warm water bath taken in the sitting position. The water covers only the hips and butt (buttocks). We recommend using one that fits in the toilet, to help with ease of use and cleanliness. It may be used for either healing or cleaning purposes. Sitz baths are also used to relieve pain, itching, or muscle tightening (spasms). The water may contain medicine. Moist heat will help you heal and relax.  HOME CARE  Take 3 to 4 sitz baths a day. 18. Fill the bathtub half-full with warm water. 19. Sit in the water and open the drain a little. 20. Turn on the warm water to keep the tub half-full. Keep the water running constantly. 21. Soak in the water for 15 to 20 minutes. 22. After the sitz bath, pat the affected area dry. GET HELP RIGHT AWAY IF: You get worse instead of better. Stop the sitz baths if you get worse. MAKE SURE YOU:  Understand these instructions.  Will watch your condition.  Will get help right away if you are not doing well or get worse. Document Released: 08/04/2004 Document Revised: 03/21/2012 Document Reviewed: 10/25/2010 [facility] Patient Information 2015 [place], [place]. This information is not intended to replace advice given to you by your health care provider. Make sure you discuss any questions  you have with your health care provider.

## 2015-08-07 NOTE — Progress Notes (Signed)
Discharge instructions complete and prescriptions given. Patient verbalizes understanding of teaching. Patient discharged home at 1345. 

## 2016-12-30 IMAGING — US US OB LIMITED
1 series · 14 of 17 positions shown · non-contrast
Comparison: none

CLINICAL DATA: Pelvic cramping on an off for several days.

EXAM:
LIMITED OBSTETRIC ULTRASOUND

[Series 1: us ob limited · 0.20mm/px · 14 of 17 slices shown]
[im 1/17]
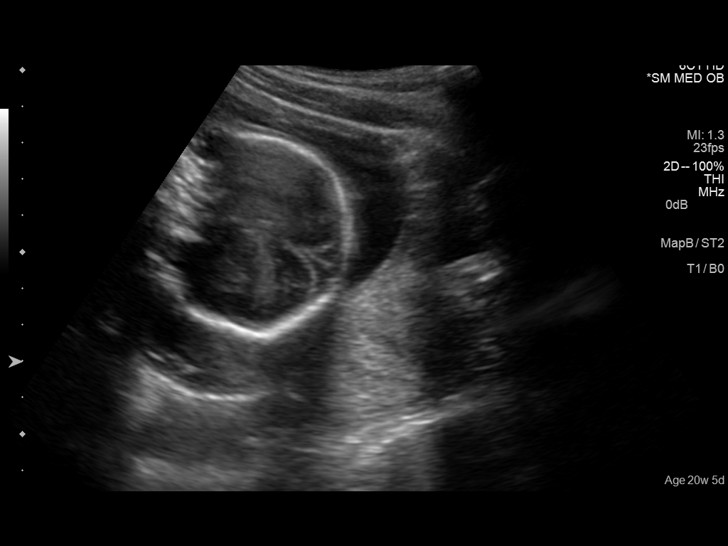
[im 2/17]
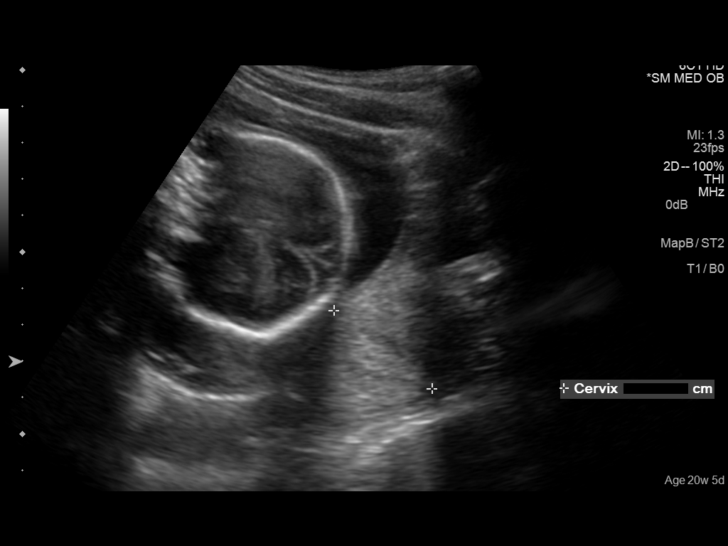
[im 4/17]
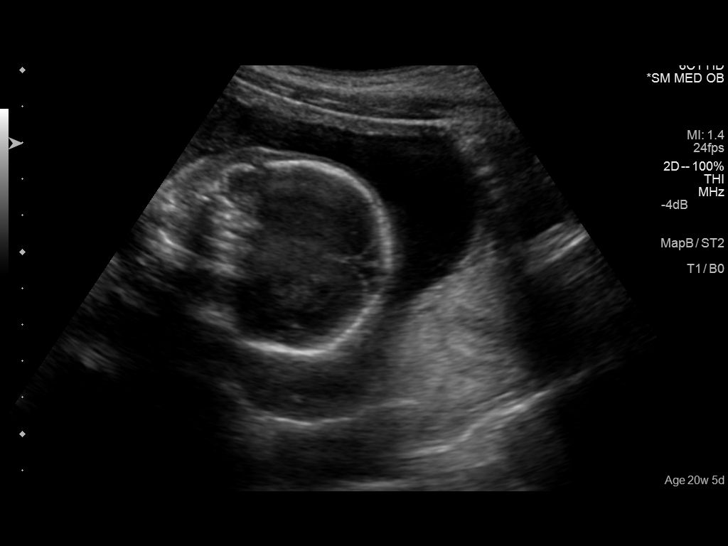
[im 5/17]
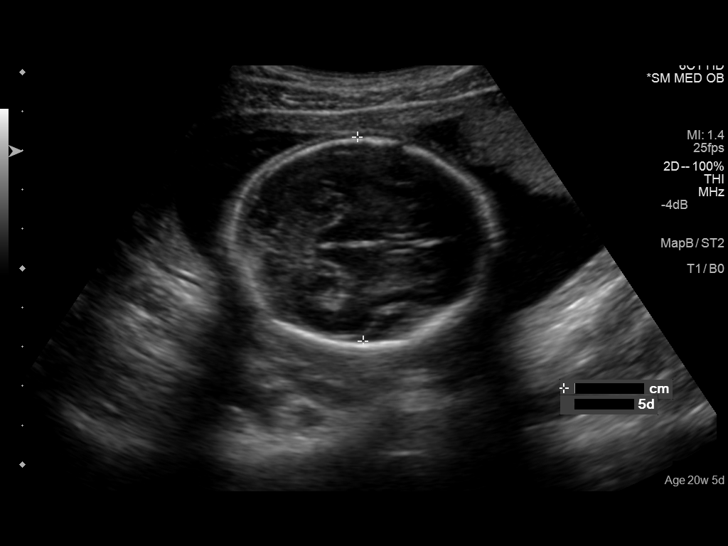
[im 6/17]
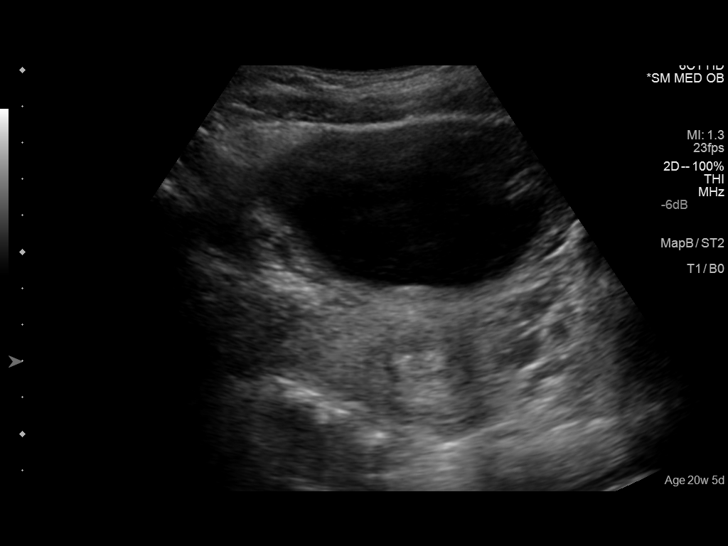
[im 7/17]
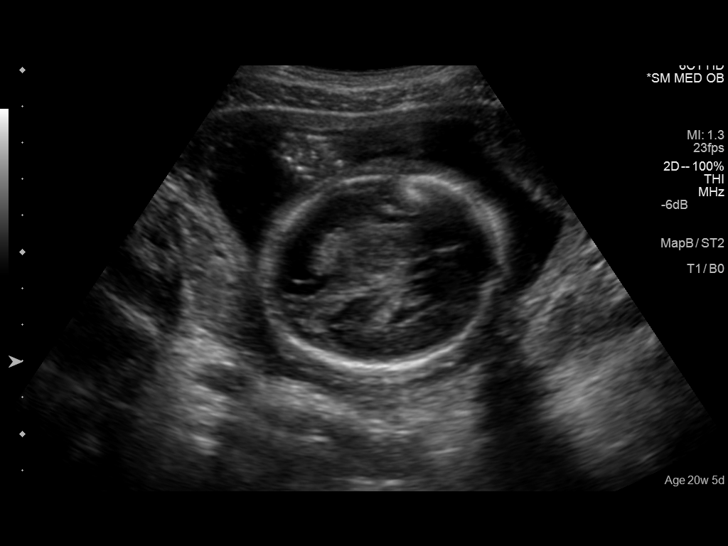
[im 8/17]
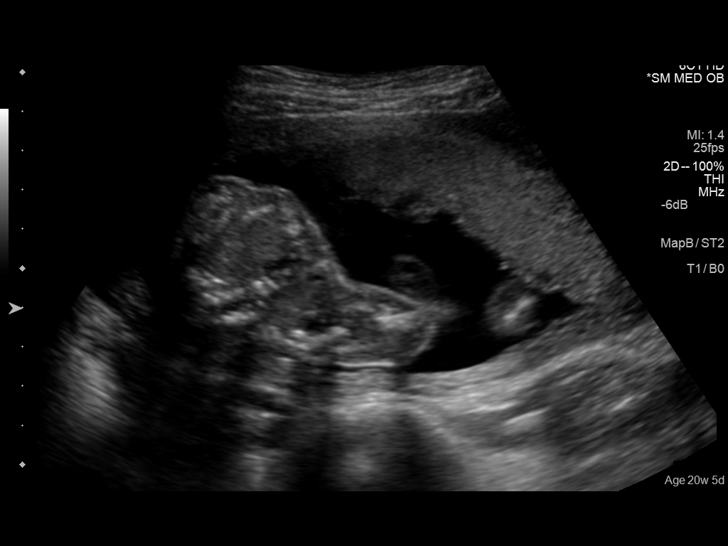
[im 10/17]
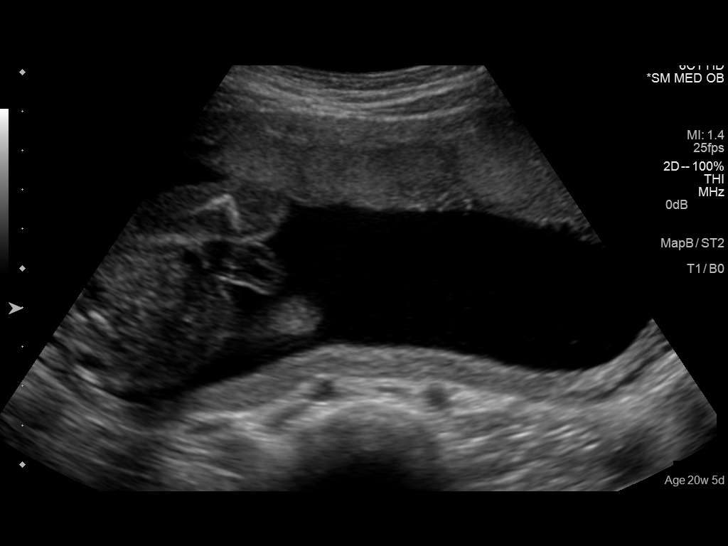
[im 11/17]
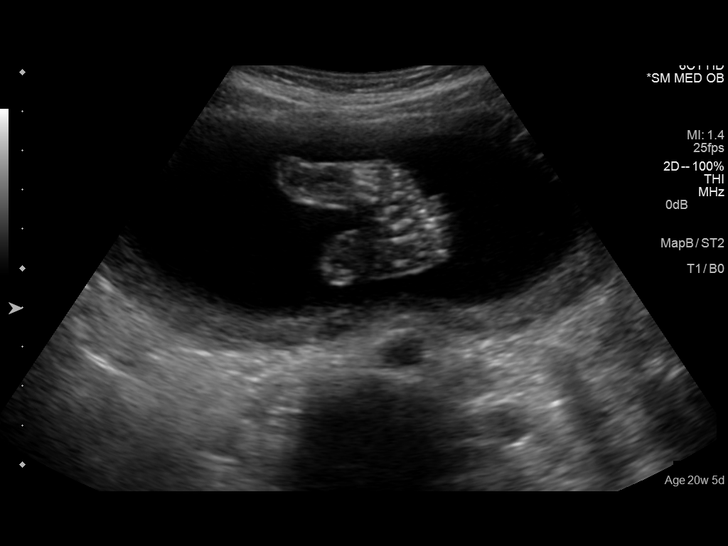
[im 12/17]
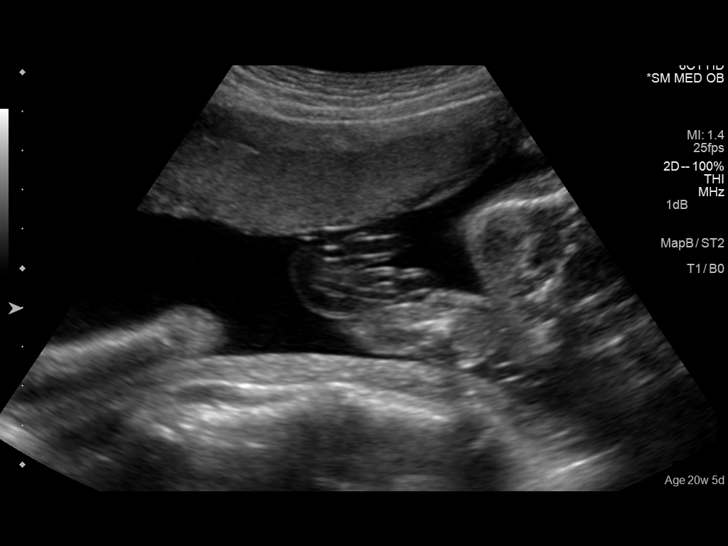
[im 13/17]
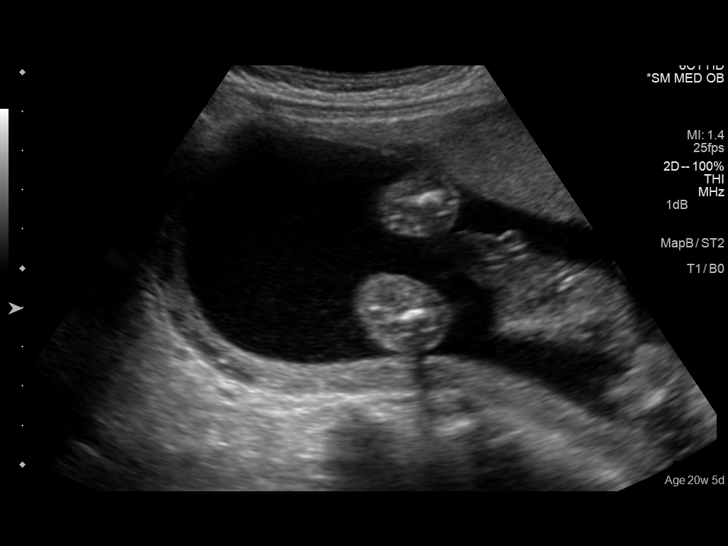
[im 14/17]
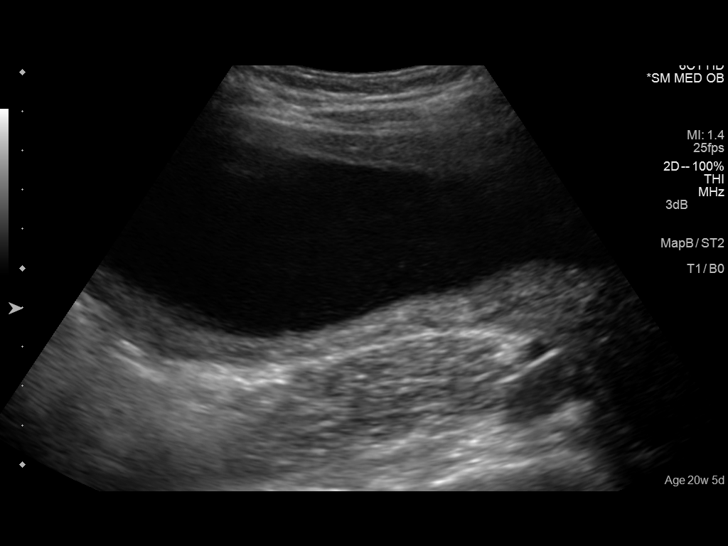
[im 16/17]
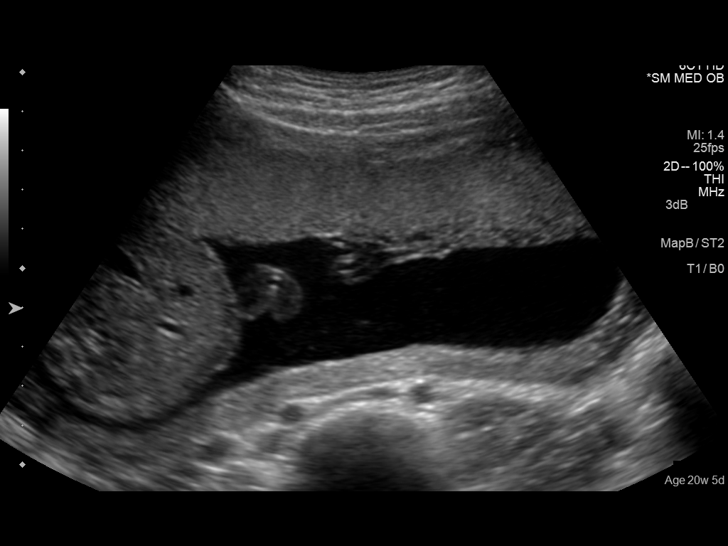
[im 17/17]
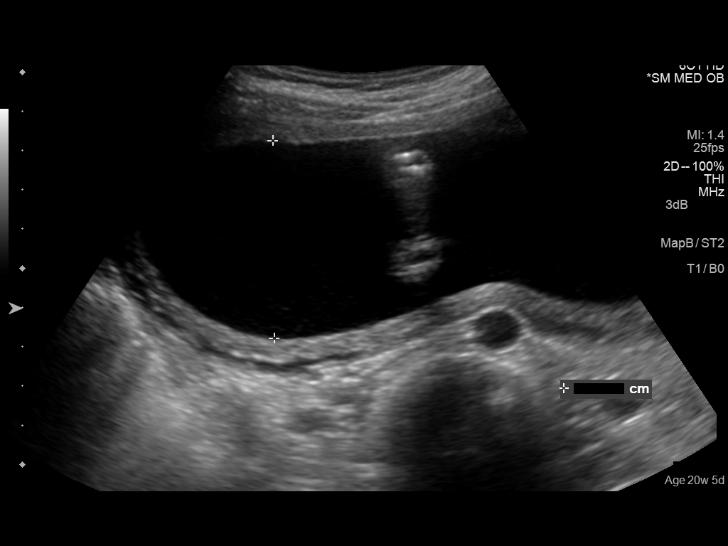

[14 of 17 positions shown; findings below may reference images not displayed]

FINDINGS: Number of Fetuses: 1

Heart Rate:  155 bpm

Movement: Yes

Presentation: Cephalic

Placental Location: Anterior

Previa: No

Amniotic Fluid (Subjective):  Within normal limits.

BPD:  5.2cm 21w  5d

MATERNAL FINDINGS:

Cervix:  Appears closed.

Uterus/Adnexae:  Ovaries not visualized.
IMPRESSION: Single living intrauterine fetus estimated at 21 weeks and 5 days
gestation.

Normal placenta and normal amniotic fluid volume.

This exam is performed on an emergent basis and does not
comprehensively evaluate fetal size, dating, or anatomy; follow-up
complete OB US should be considered if further fetal assessment is
warranted.

## 2016-12-30 IMAGING — US US RENAL
1 series · 14 of 25 positions shown · non-contrast
Comparison: None.

CLINICAL DATA: Bloody urine

EXAM:
RENAL / URINARY TRACT ULTRASOUND COMPLETE

[Series 1: us renal · 0.21mm/px · 14 of 40 slices shown]
[im 1/40]
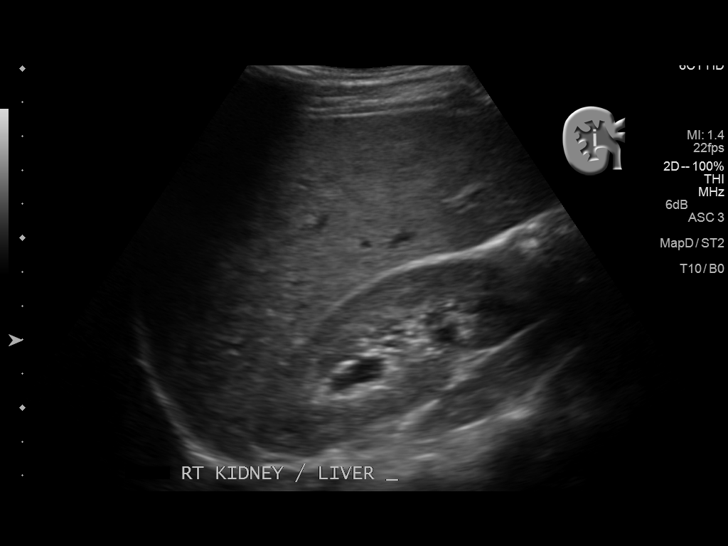
[im 4/40]
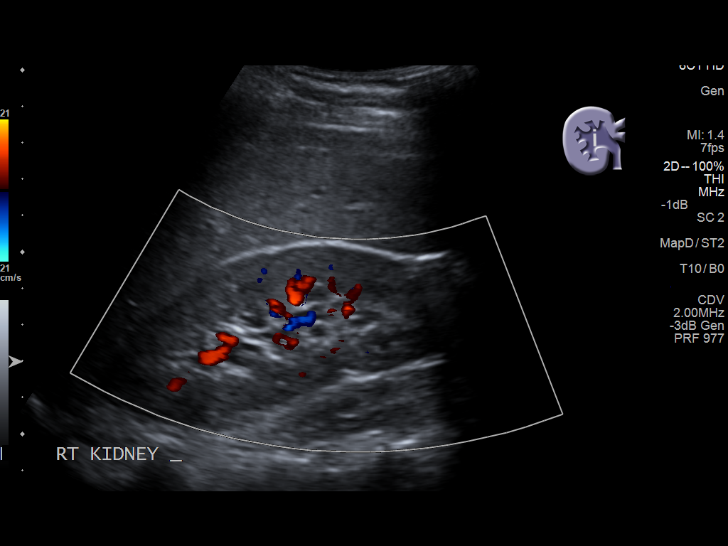
[im 7/40]
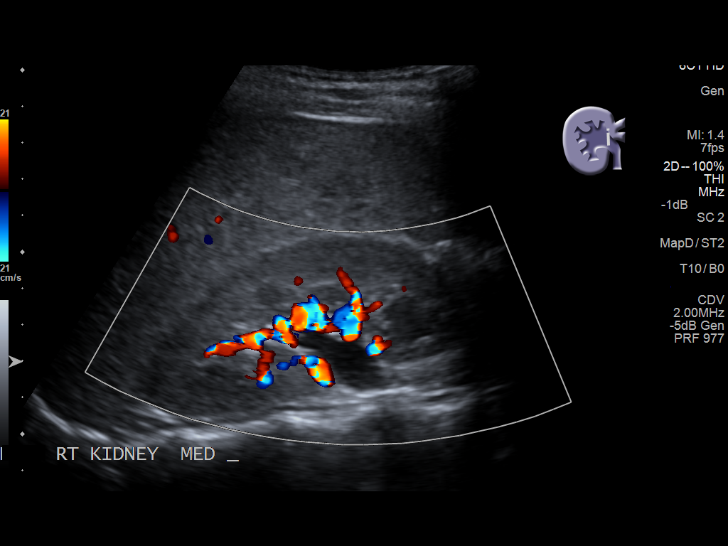
[im 10/40]
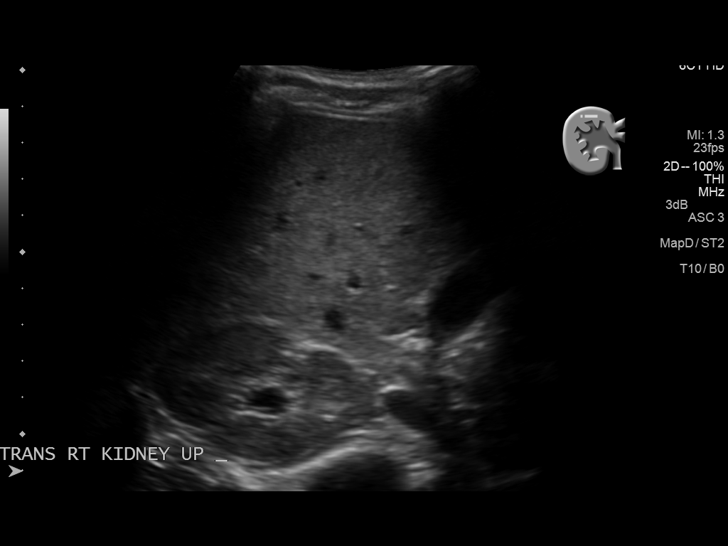
[im 14/40]
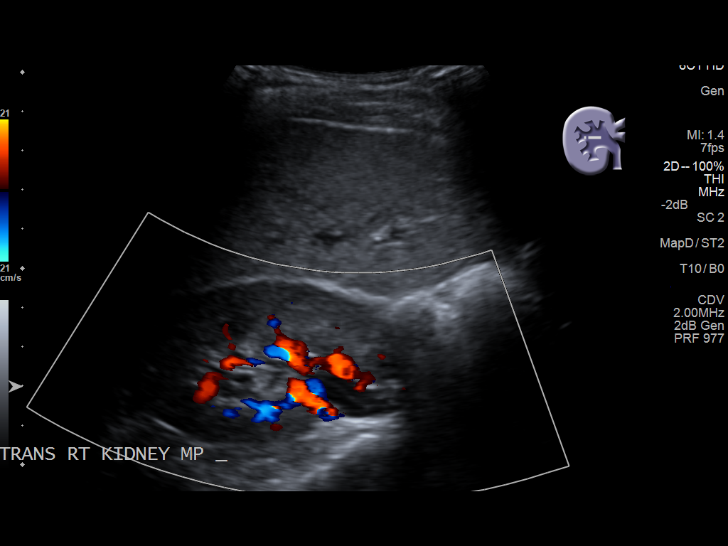
[im 15/40]
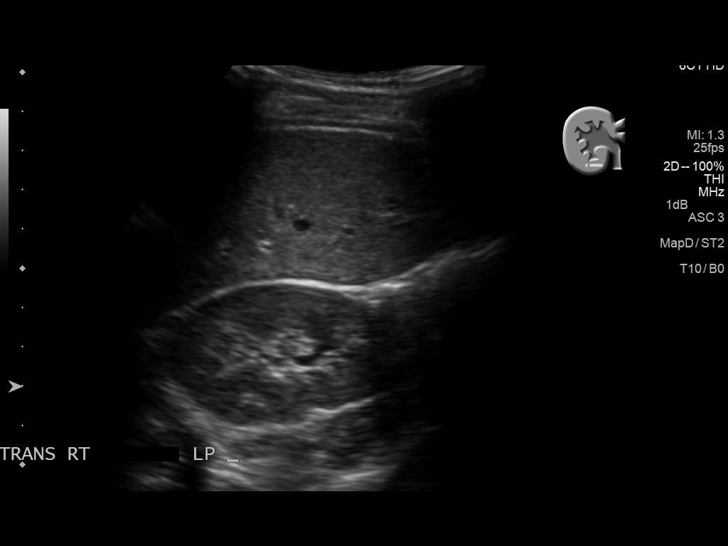
[im 18/40]
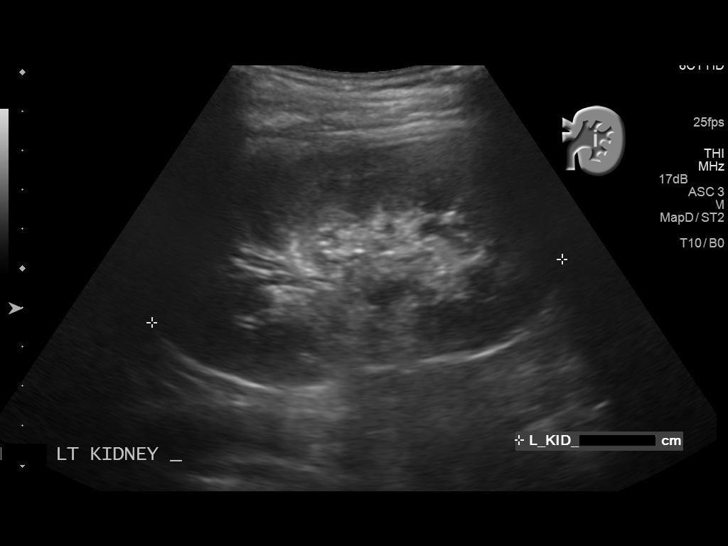
[im 22/40]
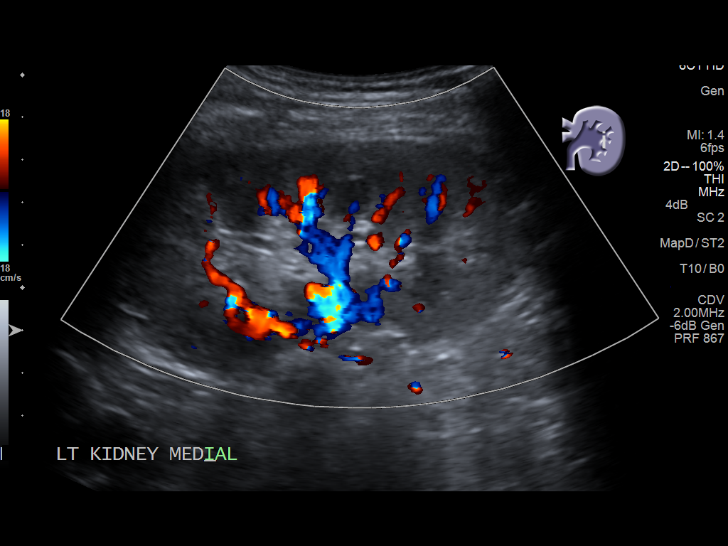
[im 25/40]
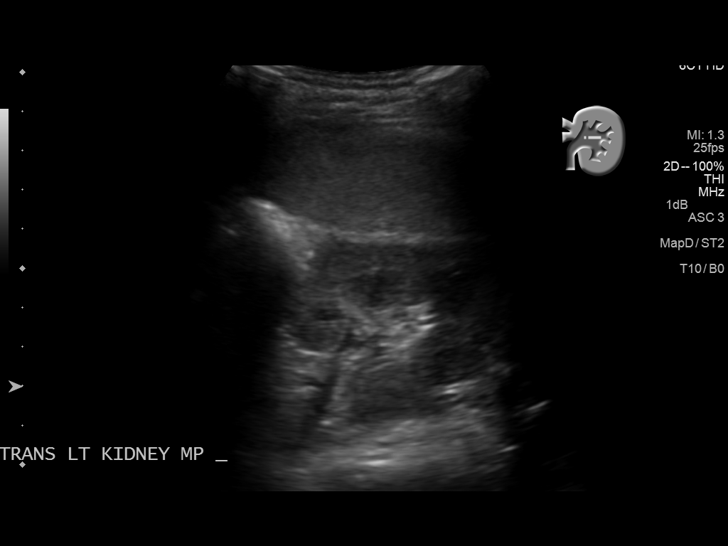
[im 27/40]
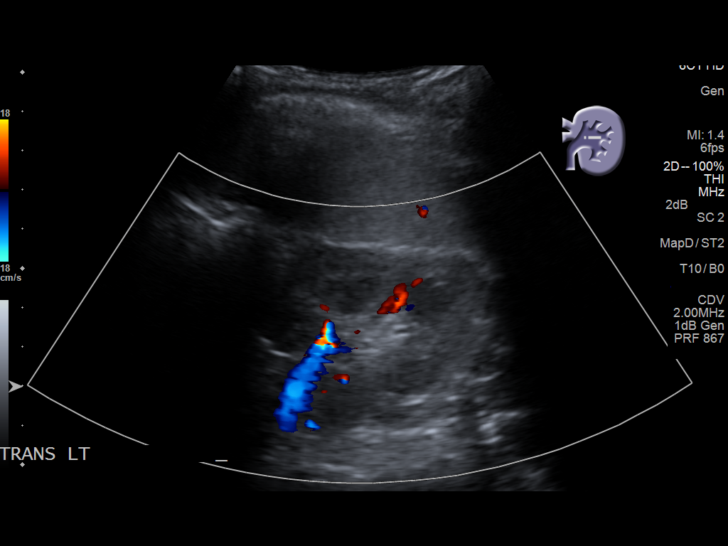
[im 30/40]
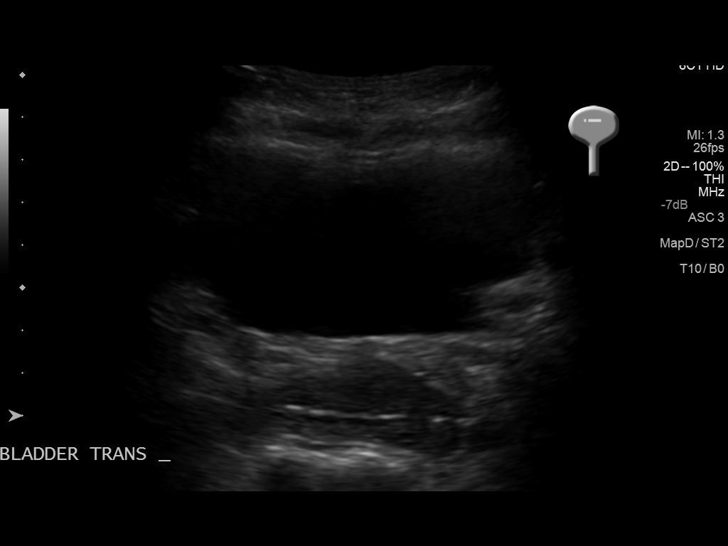
[im 33/40]
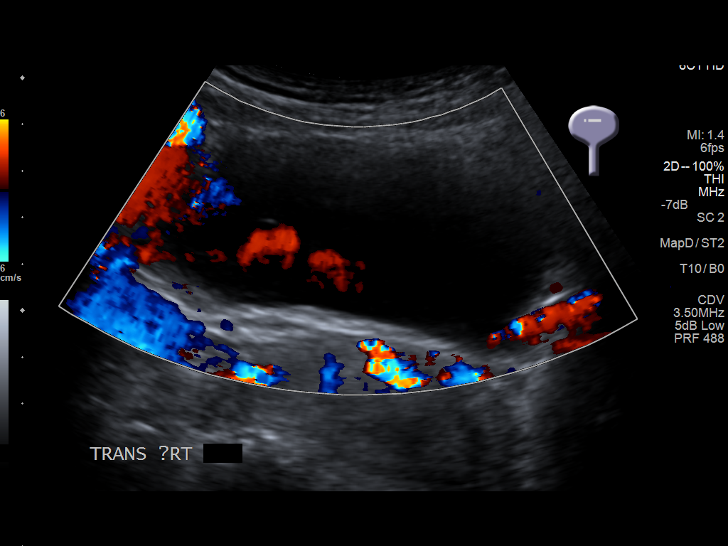
[im 36/40]
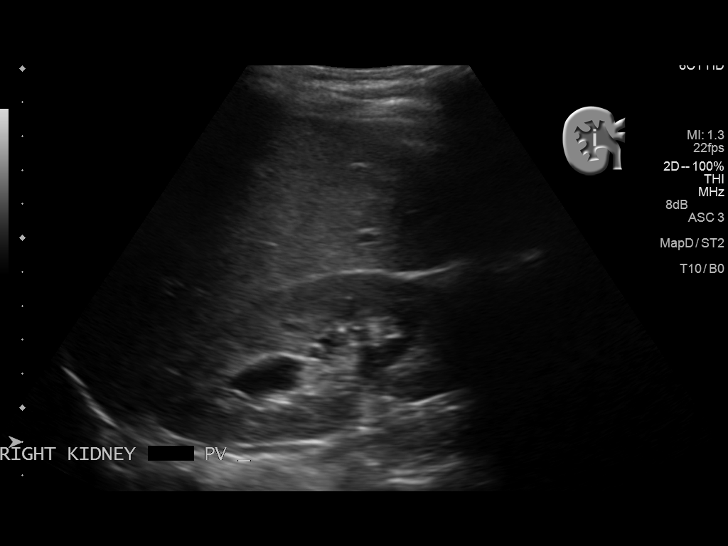
[im 40/40]
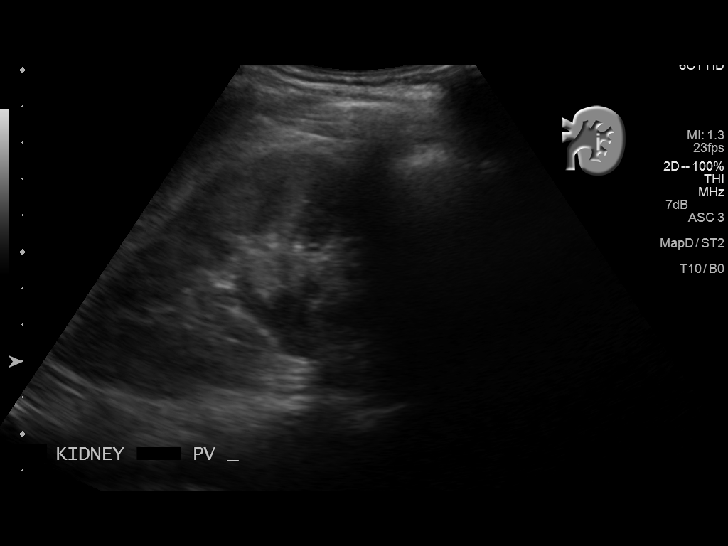

[14 of 25 positions shown; findings below may reference images not displayed]

FINDINGS: Right Kidney:

Length: 10.6 cm.  Mild hydronephrosis.  No calcified calculi.

Left Kidney:

Length: 10.6 cm..  No frank hydronephrosis.  No calcified calculi.

Bladder:

Appears normal for degree of bladder distention.
IMPRESSION: Mild right hydronephrosis. No calcified renal calculi. Unremarkable
urinary bladder.
